# Patient Record
Sex: Male | Born: 1960
Health system: Southern US, Community
[De-identification: ages and names within clinical notes are randomized; demographics above are authoritative.]

## PROBLEM LIST (undated history)

## (undated) DIAGNOSIS — K589 Irritable bowel syndrome without diarrhea: Secondary | ICD-10-CM

## (undated) DIAGNOSIS — K5120 Ulcerative (chronic) proctitis without complications: Secondary | ICD-10-CM

## (undated) HISTORY — PX: HERNIA REPAIR: SHX51

---

## 2018-09-01 DIAGNOSIS — M5481 Occipital neuralgia: Secondary | ICD-10-CM | POA: Diagnosis not present

## 2018-09-01 DIAGNOSIS — G8929 Other chronic pain: Secondary | ICD-10-CM | POA: Diagnosis not present

## 2018-09-08 DIAGNOSIS — M542 Cervicalgia: Secondary | ICD-10-CM | POA: Diagnosis not present

## 2018-09-10 DIAGNOSIS — M542 Cervicalgia: Secondary | ICD-10-CM | POA: Diagnosis not present

## 2018-09-16 DIAGNOSIS — M542 Cervicalgia: Secondary | ICD-10-CM | POA: Diagnosis not present

## 2018-09-17 DIAGNOSIS — M542 Cervicalgia: Secondary | ICD-10-CM | POA: Diagnosis not present

## 2018-10-12 DIAGNOSIS — G44229 Chronic tension-type headache, not intractable: Secondary | ICD-10-CM | POA: Diagnosis not present

## 2018-10-12 DIAGNOSIS — G4489 Other headache syndrome: Secondary | ICD-10-CM | POA: Diagnosis not present

## 2018-10-12 DIAGNOSIS — R531 Weakness: Secondary | ICD-10-CM | POA: Diagnosis not present

## 2018-10-12 DIAGNOSIS — M2560 Stiffness of unspecified joint, not elsewhere classified: Secondary | ICD-10-CM | POA: Diagnosis not present

## 2018-10-20 DIAGNOSIS — R531 Weakness: Secondary | ICD-10-CM | POA: Diagnosis not present

## 2018-10-20 DIAGNOSIS — G44229 Chronic tension-type headache, not intractable: Secondary | ICD-10-CM | POA: Diagnosis not present

## 2018-10-20 DIAGNOSIS — G4489 Other headache syndrome: Secondary | ICD-10-CM | POA: Diagnosis not present

## 2018-10-20 DIAGNOSIS — M2560 Stiffness of unspecified joint, not elsewhere classified: Secondary | ICD-10-CM | POA: Diagnosis not present

## 2018-11-02 DIAGNOSIS — M47812 Spondylosis without myelopathy or radiculopathy, cervical region: Secondary | ICD-10-CM | POA: Diagnosis not present

## 2018-11-02 DIAGNOSIS — G4489 Other headache syndrome: Secondary | ICD-10-CM | POA: Diagnosis not present

## 2018-11-02 DIAGNOSIS — M47896 Other spondylosis, lumbar region: Secondary | ICD-10-CM | POA: Diagnosis not present

## 2018-11-02 DIAGNOSIS — M48061 Spinal stenosis, lumbar region without neurogenic claudication: Secondary | ICD-10-CM | POA: Diagnosis not present

## 2018-11-02 DIAGNOSIS — M9971 Connective tissue and disc stenosis of intervertebral foramina of cervical region: Secondary | ICD-10-CM | POA: Diagnosis not present

## 2018-11-03 DIAGNOSIS — G4489 Other headache syndrome: Secondary | ICD-10-CM | POA: Diagnosis not present

## 2018-11-03 DIAGNOSIS — Z6826 Body mass index (BMI) 26.0-26.9, adult: Secondary | ICD-10-CM | POA: Diagnosis not present

## 2018-11-16 DIAGNOSIS — M530 Cervicocranial syndrome: Secondary | ICD-10-CM | POA: Diagnosis not present

## 2018-11-24 DIAGNOSIS — M530 Cervicocranial syndrome: Secondary | ICD-10-CM | POA: Diagnosis not present

## 2018-11-26 DIAGNOSIS — H524 Presbyopia: Secondary | ICD-10-CM | POA: Diagnosis not present

## 2018-11-26 DIAGNOSIS — H2513 Age-related nuclear cataract, bilateral: Secondary | ICD-10-CM | POA: Diagnosis not present

## 2018-11-26 DIAGNOSIS — H43813 Vitreous degeneration, bilateral: Secondary | ICD-10-CM | POA: Diagnosis not present

## 2018-12-01 DIAGNOSIS — M530 Cervicocranial syndrome: Secondary | ICD-10-CM | POA: Diagnosis not present

## 2018-12-02 DIAGNOSIS — Z0000 Encounter for general adult medical examination without abnormal findings: Secondary | ICD-10-CM | POA: Diagnosis not present

## 2018-12-08 DIAGNOSIS — M530 Cervicocranial syndrome: Secondary | ICD-10-CM | POA: Diagnosis not present

## 2018-12-17 DIAGNOSIS — Z0000 Encounter for general adult medical examination without abnormal findings: Secondary | ICD-10-CM | POA: Diagnosis not present

## 2018-12-17 DIAGNOSIS — Z6826 Body mass index (BMI) 26.0-26.9, adult: Secondary | ICD-10-CM | POA: Diagnosis not present

## 2018-12-17 DIAGNOSIS — Z23 Encounter for immunization: Secondary | ICD-10-CM | POA: Diagnosis not present

## 2018-12-25 DIAGNOSIS — M530 Cervicocranial syndrome: Secondary | ICD-10-CM | POA: Diagnosis not present

## 2019-05-04 DIAGNOSIS — R109 Unspecified abdominal pain: Secondary | ICD-10-CM | POA: Diagnosis not present

## 2019-05-14 DIAGNOSIS — K5900 Constipation, unspecified: Secondary | ICD-10-CM | POA: Diagnosis not present

## 2019-06-29 ENCOUNTER — Other Ambulatory Visit: Payer: Self-pay | Admitting: Gastroenterology

## 2019-06-29 DIAGNOSIS — K5900 Constipation, unspecified: Secondary | ICD-10-CM | POA: Diagnosis not present

## 2019-06-29 DIAGNOSIS — R109 Unspecified abdominal pain: Secondary | ICD-10-CM | POA: Diagnosis not present

## 2019-06-29 DIAGNOSIS — R634 Abnormal weight loss: Secondary | ICD-10-CM | POA: Diagnosis not present

## 2019-07-07 ENCOUNTER — Other Ambulatory Visit: Payer: Self-pay | Admitting: Gastroenterology

## 2019-07-07 DIAGNOSIS — R109 Unspecified abdominal pain: Secondary | ICD-10-CM

## 2019-07-07 DIAGNOSIS — R194 Change in bowel habit: Secondary | ICD-10-CM

## 2019-07-07 DIAGNOSIS — R634 Abnormal weight loss: Secondary | ICD-10-CM

## 2019-07-09 ENCOUNTER — Ambulatory Visit
Admission: RE | Admit: 2019-07-09 | Discharge: 2019-07-09 | Disposition: A | Discharge status: Home / Self Care | Payer: BC Managed Care – PPO | Source: Ambulatory Visit | Attending: Gastroenterology | Admitting: Gastroenterology

## 2019-07-09 ENCOUNTER — Other Ambulatory Visit: Payer: Self-pay

## 2019-07-09 DIAGNOSIS — R634 Abnormal weight loss: Secondary | ICD-10-CM

## 2019-07-09 DIAGNOSIS — R109 Unspecified abdominal pain: Secondary | ICD-10-CM

## 2019-07-09 DIAGNOSIS — R194 Change in bowel habit: Secondary | ICD-10-CM

## 2019-07-15 DIAGNOSIS — R109 Unspecified abdominal pain: Secondary | ICD-10-CM | POA: Diagnosis not present

## 2019-07-29 ENCOUNTER — Other Ambulatory Visit: Payer: Self-pay

## 2019-07-29 ENCOUNTER — Ambulatory Visit
Admission: RE | Admit: 2019-07-29 | Discharge: 2019-07-29 | Disposition: A | Discharge status: Home / Self Care | Payer: BC Managed Care – PPO | Source: Ambulatory Visit | Attending: Gastroenterology | Admitting: Gastroenterology

## 2019-07-29 DIAGNOSIS — R109 Unspecified abdominal pain: Secondary | ICD-10-CM

## 2019-07-29 MED ORDER — IOPAMIDOL (ISOVUE-300) INJECTION 61%
100.0000 mL | Freq: Once | INTRAVENOUS | Status: AC | PRN
  Administered 2019-07-29: 100 mL via INTRAVENOUS

## 2019-08-26 DIAGNOSIS — Z86010 Personal history of colonic polyps: Secondary | ICD-10-CM | POA: Diagnosis not present

## 2019-08-26 DIAGNOSIS — R109 Unspecified abdominal pain: Secondary | ICD-10-CM | POA: Diagnosis not present

## 2019-11-04 DIAGNOSIS — Z1159 Encounter for screening for other viral diseases: Secondary | ICD-10-CM | POA: Diagnosis not present

## 2019-11-09 DIAGNOSIS — Z86010 Personal history of colonic polyps: Secondary | ICD-10-CM | POA: Diagnosis not present

## 2019-11-09 DIAGNOSIS — R1013 Epigastric pain: Secondary | ICD-10-CM | POA: Diagnosis not present

## 2019-11-09 DIAGNOSIS — K2930 Chronic superficial gastritis without bleeding: Secondary | ICD-10-CM | POA: Diagnosis not present

## 2019-11-09 DIAGNOSIS — D122 Benign neoplasm of ascending colon: Secondary | ICD-10-CM | POA: Diagnosis not present

## 2020-01-18 DIAGNOSIS — R42 Dizziness and giddiness: Secondary | ICD-10-CM | POA: Diagnosis not present

## 2020-01-18 DIAGNOSIS — M62838 Other muscle spasm: Secondary | ICD-10-CM | POA: Diagnosis not present

## 2020-01-18 DIAGNOSIS — R0789 Other chest pain: Secondary | ICD-10-CM | POA: Diagnosis not present

## 2020-02-01 DIAGNOSIS — H1045 Other chronic allergic conjunctivitis: Secondary | ICD-10-CM | POA: Diagnosis not present

## 2020-02-01 DIAGNOSIS — H00011 Hordeolum externum right upper eyelid: Secondary | ICD-10-CM | POA: Diagnosis not present

## 2020-02-03 DIAGNOSIS — Z0000 Encounter for general adult medical examination without abnormal findings: Secondary | ICD-10-CM | POA: Diagnosis not present

## 2020-02-21 DIAGNOSIS — I700 Atherosclerosis of aorta: Secondary | ICD-10-CM | POA: Diagnosis not present

## 2020-03-03 DIAGNOSIS — R0602 Shortness of breath: Secondary | ICD-10-CM | POA: Diagnosis not present

## 2020-03-03 DIAGNOSIS — R059 Cough, unspecified: Secondary | ICD-10-CM | POA: Diagnosis not present

## 2020-03-03 DIAGNOSIS — Z03818 Encounter for observation for suspected exposure to other biological agents ruled out: Secondary | ICD-10-CM | POA: Diagnosis not present

## 2020-03-09 DIAGNOSIS — N401 Enlarged prostate with lower urinary tract symptoms: Secondary | ICD-10-CM | POA: Diagnosis not present

## 2020-03-09 DIAGNOSIS — N329 Bladder disorder, unspecified: Secondary | ICD-10-CM | POA: Diagnosis not present

## 2020-03-09 DIAGNOSIS — R3915 Urgency of urination: Secondary | ICD-10-CM | POA: Diagnosis not present

## 2020-04-18 DIAGNOSIS — Z0000 Encounter for general adult medical examination without abnormal findings: Secondary | ICD-10-CM | POA: Diagnosis not present

## 2020-04-18 DIAGNOSIS — Z1389 Encounter for screening for other disorder: Secondary | ICD-10-CM | POA: Diagnosis not present

## 2020-04-18 DIAGNOSIS — Z6825 Body mass index (BMI) 25.0-25.9, adult: Secondary | ICD-10-CM | POA: Diagnosis not present

## 2020-04-18 DIAGNOSIS — Z23 Encounter for immunization: Secondary | ICD-10-CM | POA: Diagnosis not present

## 2020-05-01 DIAGNOSIS — N329 Bladder disorder, unspecified: Secondary | ICD-10-CM | POA: Diagnosis not present

## 2020-05-01 DIAGNOSIS — R102 Pelvic and perineal pain: Secondary | ICD-10-CM | POA: Diagnosis not present

## 2020-05-01 DIAGNOSIS — R3915 Urgency of urination: Secondary | ICD-10-CM | POA: Diagnosis not present

## 2020-05-01 DIAGNOSIS — N401 Enlarged prostate with lower urinary tract symptoms: Secondary | ICD-10-CM | POA: Diagnosis not present

## 2020-05-10 DIAGNOSIS — M6281 Muscle weakness (generalized): Secondary | ICD-10-CM | POA: Diagnosis not present

## 2020-05-10 DIAGNOSIS — M62838 Other muscle spasm: Secondary | ICD-10-CM | POA: Diagnosis not present

## 2020-05-10 DIAGNOSIS — M6289 Other specified disorders of muscle: Secondary | ICD-10-CM | POA: Diagnosis not present

## 2020-05-10 DIAGNOSIS — R102 Pelvic and perineal pain: Secondary | ICD-10-CM | POA: Diagnosis not present

## 2020-05-31 DIAGNOSIS — M6281 Muscle weakness (generalized): Secondary | ICD-10-CM | POA: Diagnosis not present

## 2020-05-31 DIAGNOSIS — R102 Pelvic and perineal pain: Secondary | ICD-10-CM | POA: Diagnosis not present

## 2020-05-31 DIAGNOSIS — M62838 Other muscle spasm: Secondary | ICD-10-CM | POA: Diagnosis not present

## 2020-05-31 DIAGNOSIS — M6289 Other specified disorders of muscle: Secondary | ICD-10-CM | POA: Diagnosis not present

## 2020-07-11 DIAGNOSIS — R102 Pelvic and perineal pain: Secondary | ICD-10-CM | POA: Diagnosis not present

## 2020-09-05 DIAGNOSIS — K1370 Unspecified lesions of oral mucosa: Secondary | ICD-10-CM | POA: Diagnosis not present

## 2020-09-08 IMAGING — US US ABDOMEN COMPLETE
1 series · 14 of 25 positions shown · non-contrast
Comparison: None

CLINICAL DATA: Abdominal pain, weight loss and constant for 6 weeks

EXAM:
ABDOMEN ULTRASOUND COMPLETE

[Series 1: us abdomen complete · 0.21mm/px · 14 of 77 slices shown]
[im 1/77]
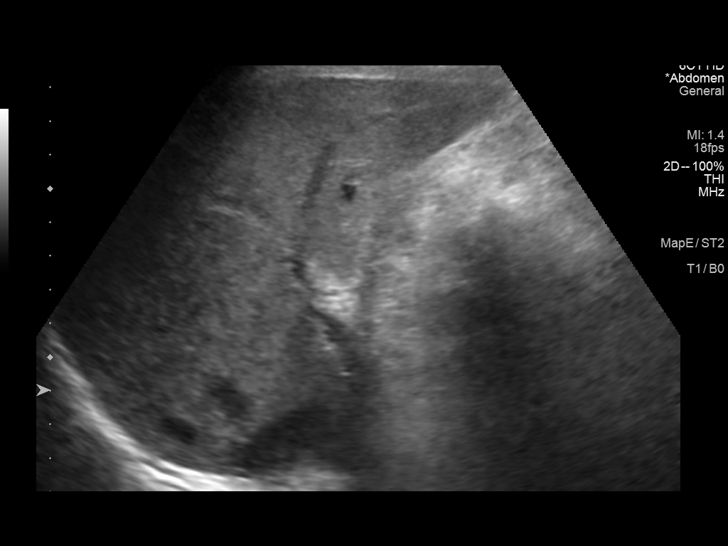
[im 7/77]
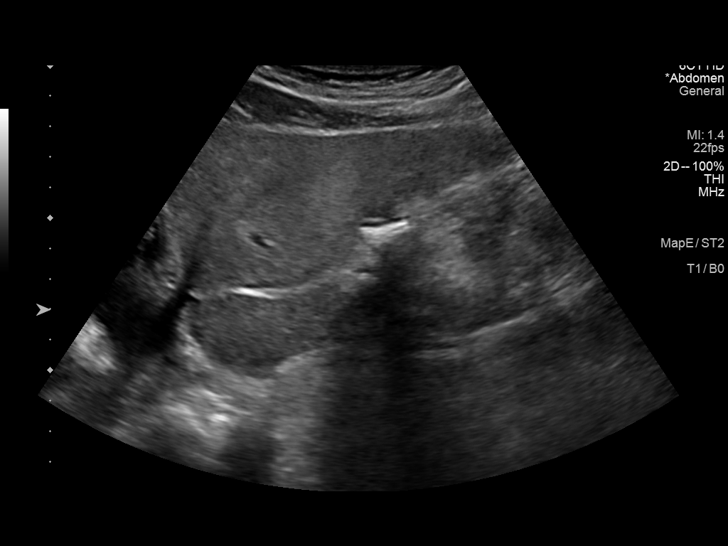
[im 13/77]
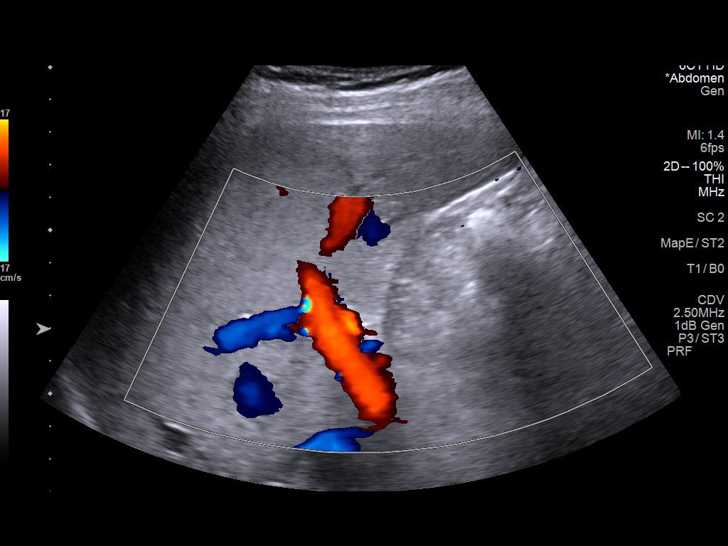
[im 20/77]
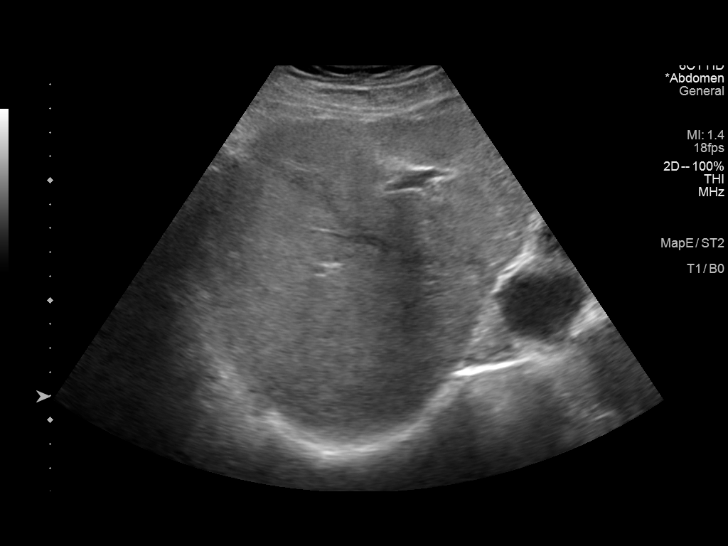
[im 26/77]
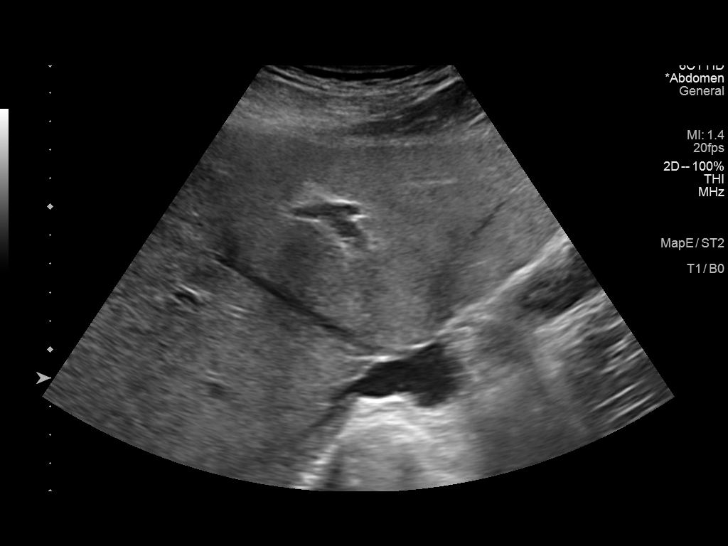
[im 29/77]
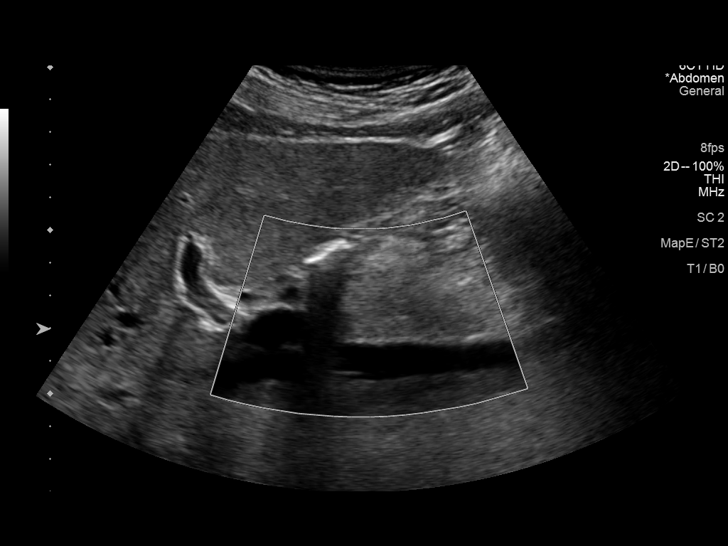
[im 35/77]
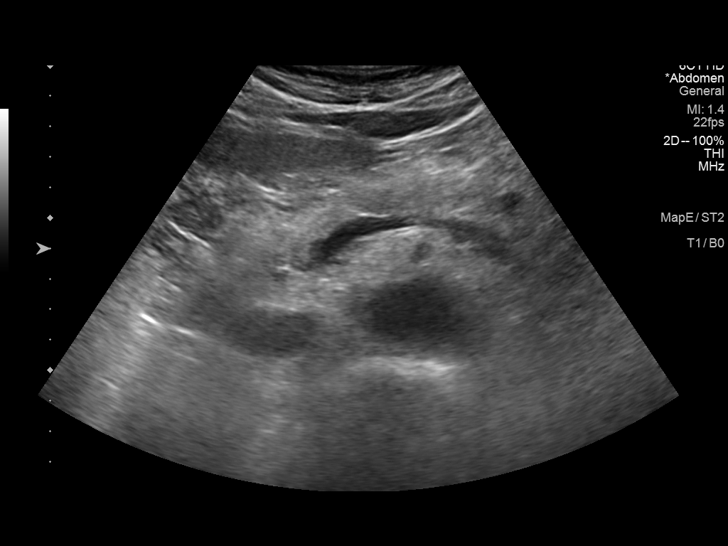
[im 42/77]
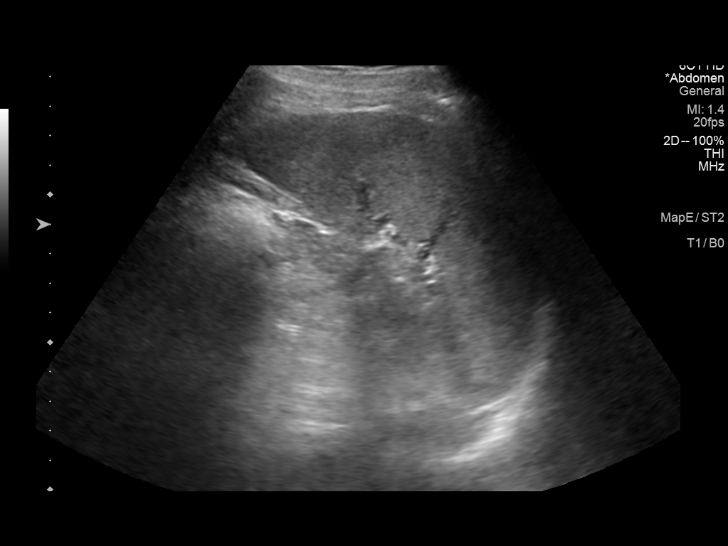
[im 48/77]
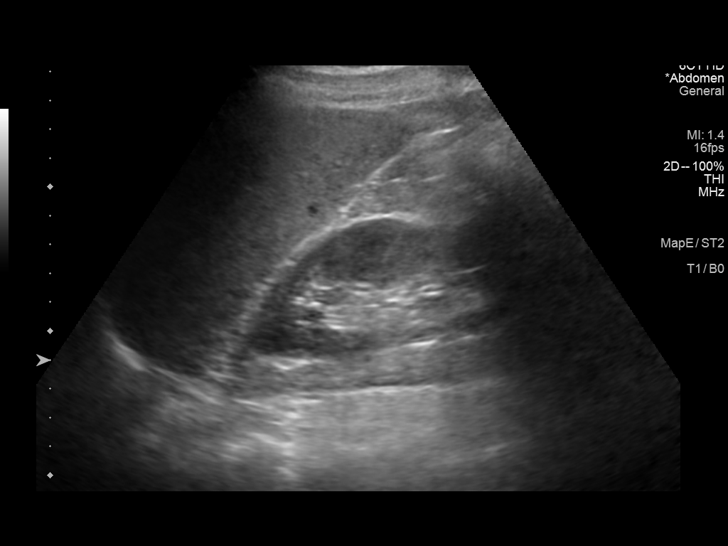
[im 51/77]
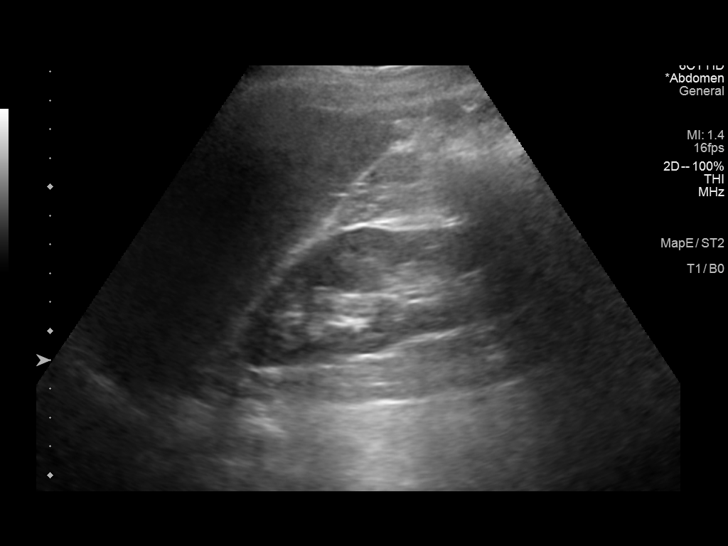
[im 58/77]
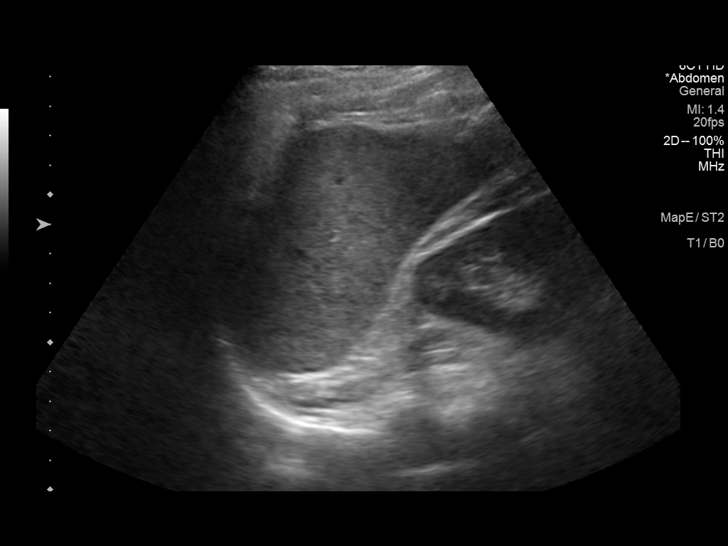
[im 64/77]
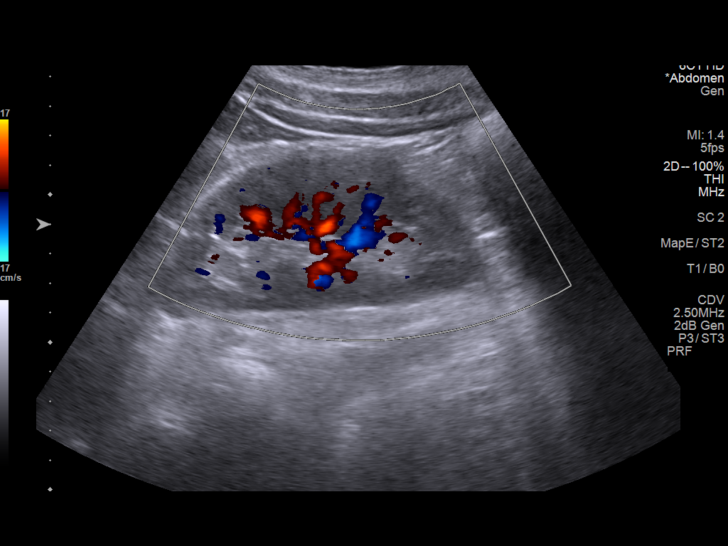
[im 70/77]
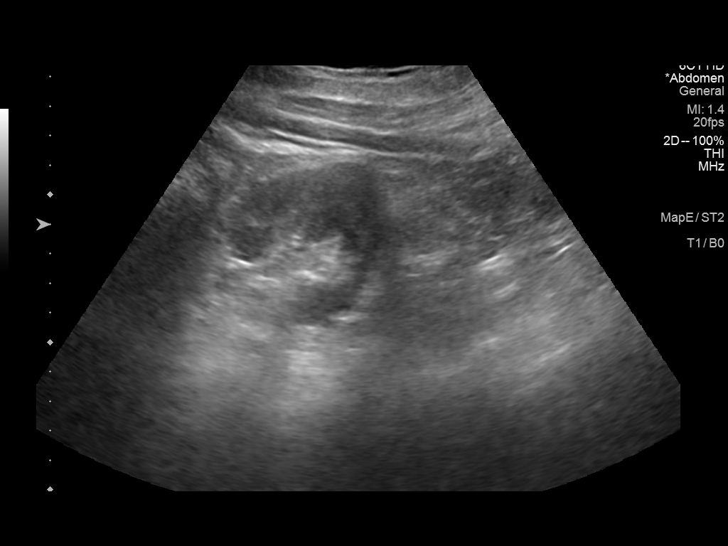
[im 77/77]
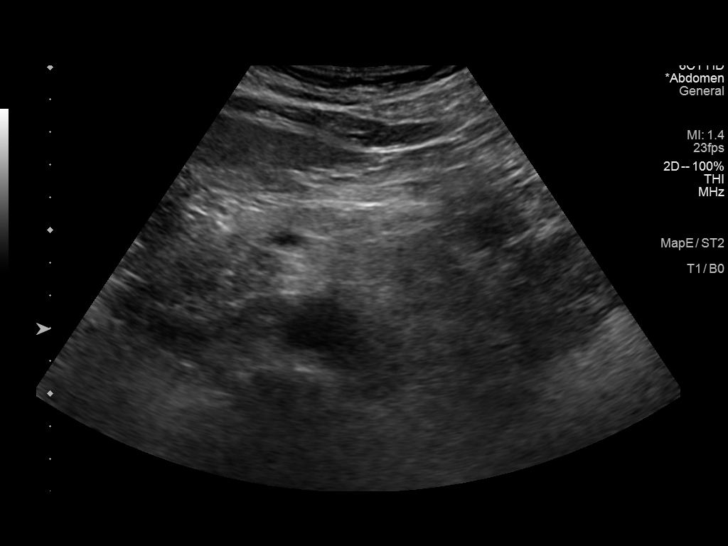

[14 of 25 positions shown; findings below may reference images not displayed]

FINDINGS: Gallbladder: Surgically absent

Common bile duct: Diameter: 4 mm, normal

Liver: Echogenic parenchyma, likely fatty infiltration though this
can be seen with cirrhosis and certain infiltrative disorders. No
focal hepatic mass or nodularity. No intrahepatic biliary
dilatation. Portal vein is patent on color Doppler imaging with
normal direction of blood flow towards the liver.

IVC: Normal appearance

Pancreas: Normal appearance

Spleen: Normal appearance, 9.8 cm length

Right Kidney: Length: 11.3 cm. Normal morphology without mass or
hydronephrosis.

Left Kidney: Length: 12.3 cm. Normal morphology without mass or
hydronephrosis.

Abdominal aorta: Normal caliber

Other findings: No free fluid
IMPRESSION: Post cholecystectomy.

Probable fatty infiltration of liver.

Otherwise normal exam.

## 2020-09-28 IMAGING — CT CT ABD-PELV W/ CM
1 of 2 series · 15 of 32 positions shown, 19 images · IV contrast (APPLIED)
Comparison: 07/09/2019 abdominal sonogram.

CLINICAL DATA: Right upper quadrant abdominal pain for 3 months
with 12 pound weight loss in the last 5 months. History of inguinal
hernia repair.

EXAM:
CT ABDOMEN AND PELVIS WITH CONTRAST
TECHNIQUE: Multidetector CT imaging of the abdomen and pelvis was performed
using the standard protocol following bolus administration of
intravenous contrast.
CONTRAST:  100mL 30VUOC-0EE IOPAMIDOL (30VUOC-0EE) INJECTION 61%

[Series 2: abd/pelvis w/cm · axial · 0.71mm/px · z∈[-507,-82]mm · 15 of 95 slices shown, 19 images]
[im 5/95  soft-tissue]
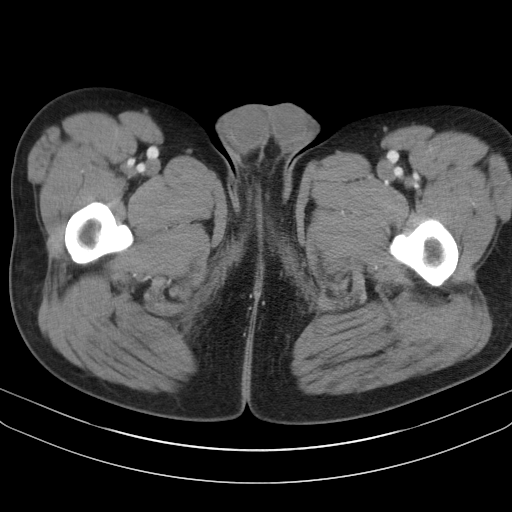
[im 5/95  bone]
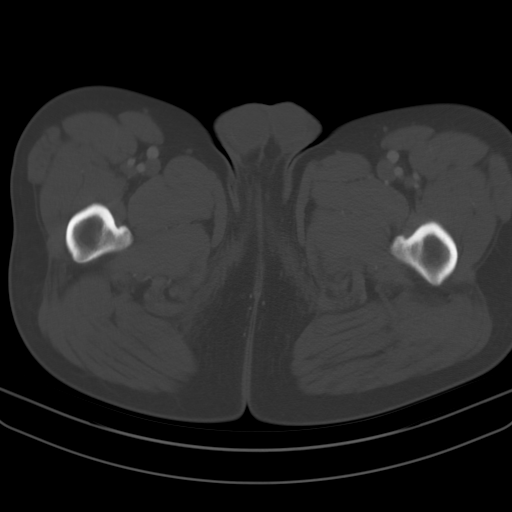
[im 13/95  soft-tissue]
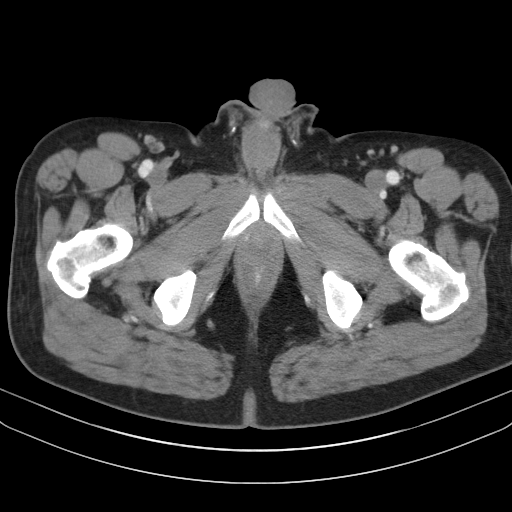
[im 22/95  soft-tissue]
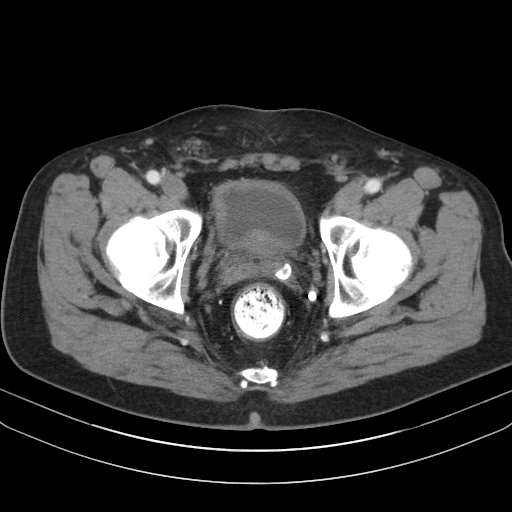
[im 26/95  soft-tissue]
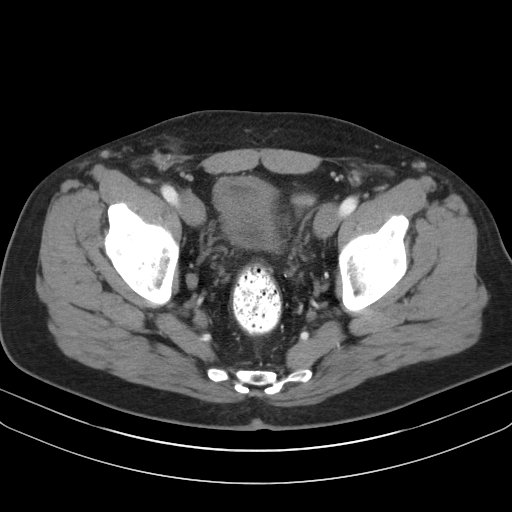
[im 35/95  soft-tissue]
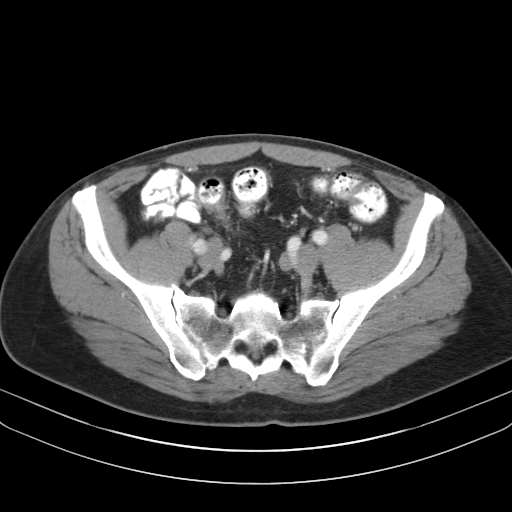
[im 39/95  soft-tissue]
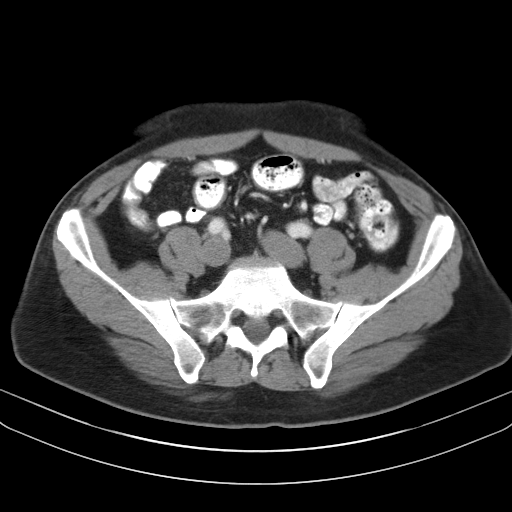
[im 48/95  soft-tissue]
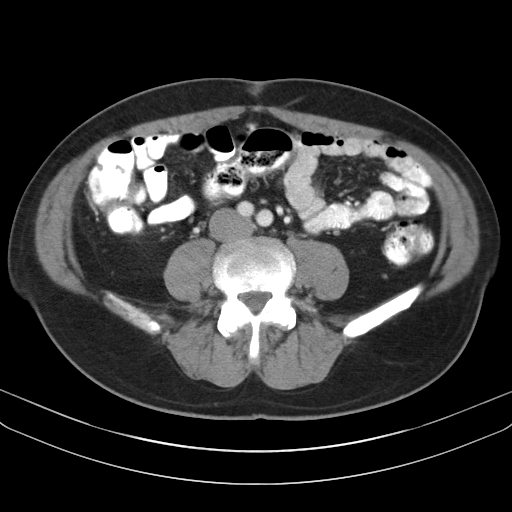
[im 56/95  soft-tissue]
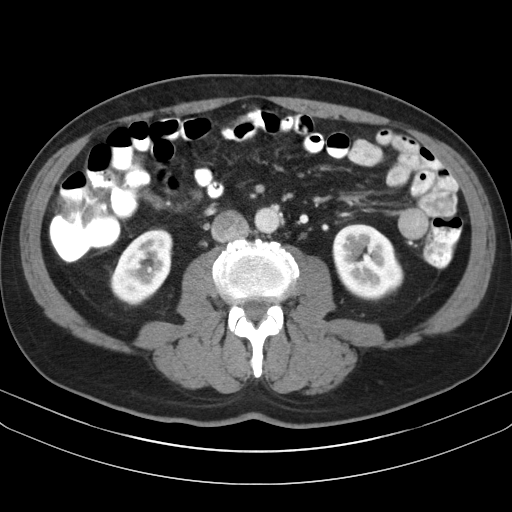
[im 60/95  soft-tissue]
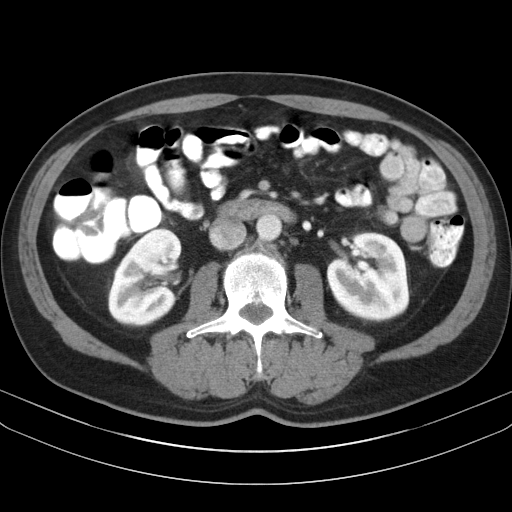
[im 60/95  bone]
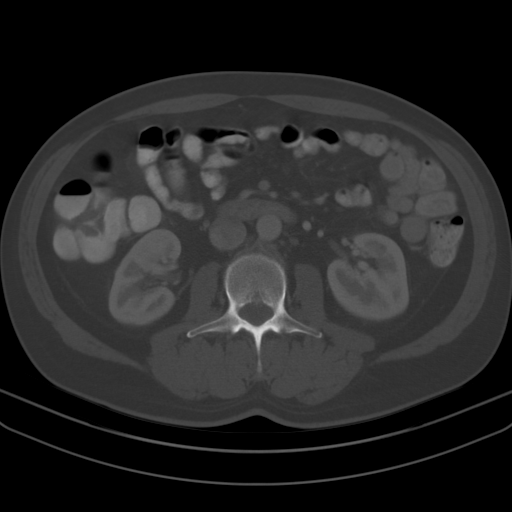
[im 69/95  soft-tissue]
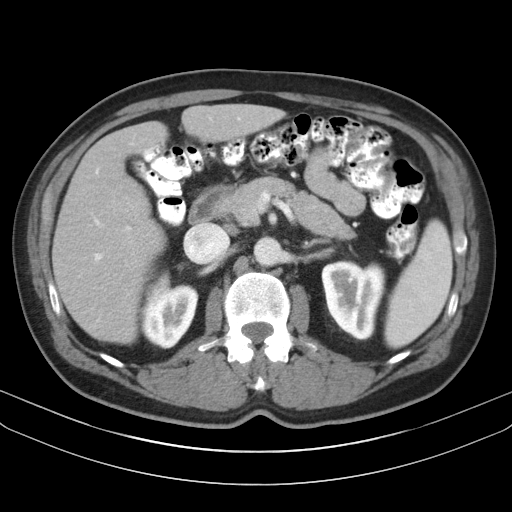
[im 73/95  soft-tissue]
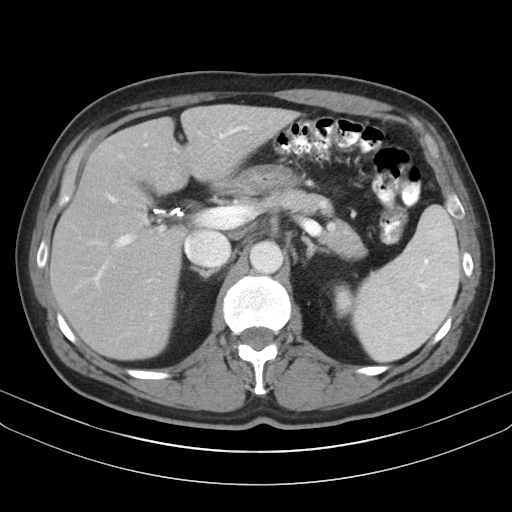
[im 77/95  lung]
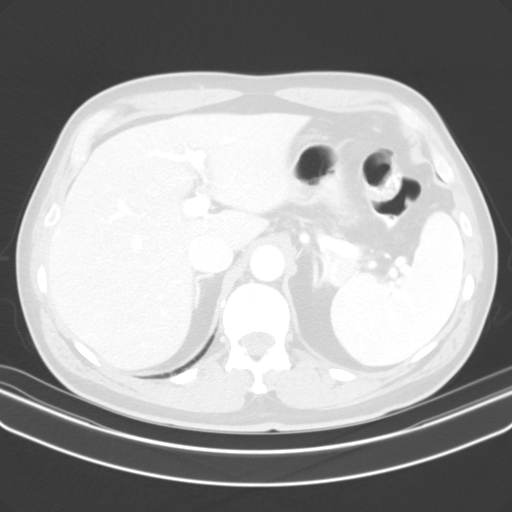
[im 82/95  soft-tissue]
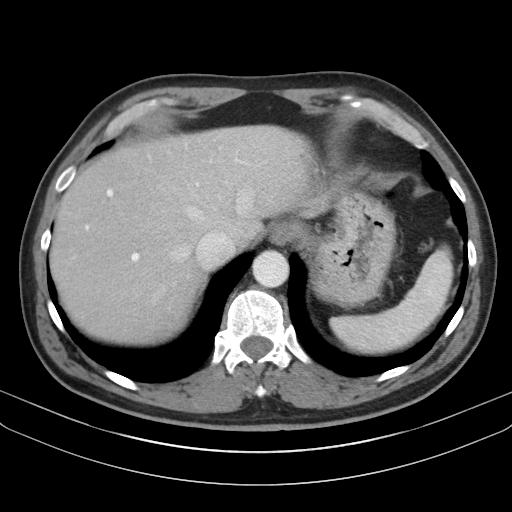
[im 82/95  lung]
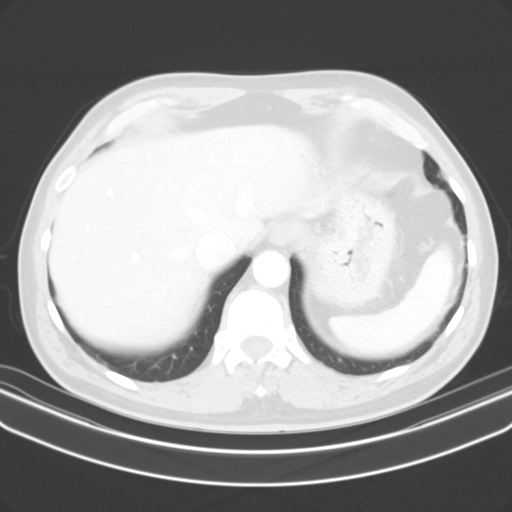
[im 86/95  lung]
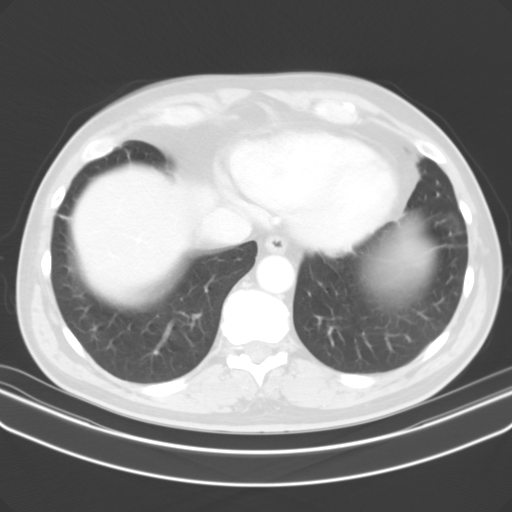
[im 90/95  soft-tissue]
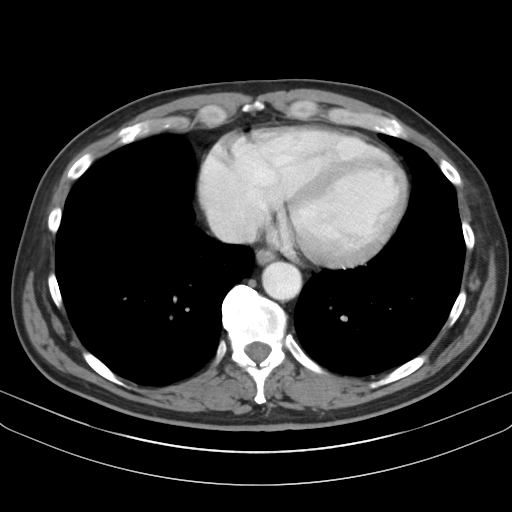
[im 90/95  lung]
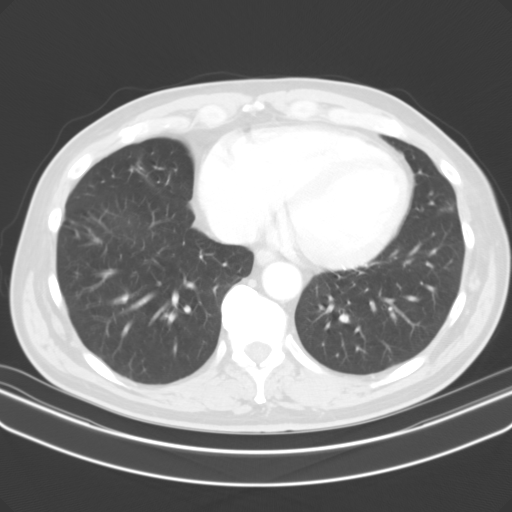

[15 of 32 positions shown; findings below may reference images not displayed]

FINDINGS: Lower chest: No significant pulmonary nodules or acute consolidative
airspace disease.

Hepatobiliary: Normal liver size. No liver mass. Cholecystectomy.
Bile ducts are within normal post cholecystectomy limits with CBD
diameter 4 mm.

Pancreas: Normal, with no mass or duct dilation.

Spleen: Normal size. No mass.

Adrenals/Urinary Tract: Normal adrenals. Normal kidneys with no
hydronephrosis and no renal mass. Suggestion of mild diffuse bladder
wall thickening. No significant bladder distention. Scratch

Stomach/Bowel: Normal non-distended stomach. Normal caliber small
bowel with no small bowel wall thickening. Normal appendix. Oral
contrast transits to the rectum. Normal large bowel with no
diverticulosis, large bowel wall thickening or pericolonic fat
stranding.

Vascular/Lymphatic: Atherosclerotic nonaneurysmal abdominal aorta.
Patent portal, splenic, hepatic and renal veins. Retroaortic left
renal vein. No pathologically enlarged lymph nodes in the abdomen or
pelvis.

Reproductive: Mild prostatomegaly.

Other: No pneumoperitoneum, ascites or focal fluid collection.

Musculoskeletal: No aggressive appearing focal osseous lesions. Mild
thoracolumbar spondylosis.
IMPRESSION: 1. No acute abnormality. No evidence of bowel obstruction or acute
bowel inflammation. Normal appendix.
2. Bile ducts are within normal post cholecystectomy limits.
3. Suggestion of mild diffuse bladder wall thickening, which could
be due to chronic bladder outlet obstruction by the mildly enlarged
prostate. Consider correlation with urinalysis.
4. Aortic Atherosclerosis ([id number]).

## 2020-11-06 DIAGNOSIS — R1011 Right upper quadrant pain: Secondary | ICD-10-CM | POA: Diagnosis not present

## 2020-11-16 DIAGNOSIS — R1011 Right upper quadrant pain: Secondary | ICD-10-CM | POA: Diagnosis not present

## 2020-11-16 DIAGNOSIS — R194 Change in bowel habit: Secondary | ICD-10-CM | POA: Diagnosis not present

## 2020-11-16 DIAGNOSIS — Z6825 Body mass index (BMI) 25.0-25.9, adult: Secondary | ICD-10-CM | POA: Diagnosis not present

## 2020-12-01 DIAGNOSIS — K2900 Acute gastritis without bleeding: Secondary | ICD-10-CM | POA: Diagnosis not present

## 2020-12-14 ENCOUNTER — Other Ambulatory Visit: Payer: Self-pay

## 2020-12-14 ENCOUNTER — Encounter (HOSPITAL_BASED_OUTPATIENT_CLINIC_OR_DEPARTMENT_OTHER): Payer: Self-pay | Admitting: *Deleted

## 2020-12-14 ENCOUNTER — Emergency Department (HOSPITAL_BASED_OUTPATIENT_CLINIC_OR_DEPARTMENT_OTHER)
Admission: EM | Admit: 2020-12-14 | Discharge: 2020-12-14 | Disposition: A | Discharge status: Home / Self Care | Payer: BC Managed Care – PPO | Attending: Emergency Medicine | Admitting: Emergency Medicine

## 2020-12-14 ENCOUNTER — Emergency Department (HOSPITAL_BASED_OUTPATIENT_CLINIC_OR_DEPARTMENT_OTHER): Payer: BC Managed Care – PPO

## 2020-12-14 DIAGNOSIS — R197 Diarrhea, unspecified: Secondary | ICD-10-CM | POA: Diagnosis not present

## 2020-12-14 DIAGNOSIS — K625 Hemorrhage of anus and rectum: Secondary | ICD-10-CM | POA: Insufficient documentation

## 2020-12-14 DIAGNOSIS — R109 Unspecified abdominal pain: Secondary | ICD-10-CM

## 2020-12-14 DIAGNOSIS — R1011 Right upper quadrant pain: Secondary | ICD-10-CM | POA: Insufficient documentation

## 2020-12-14 HISTORY — DX: Irritable bowel syndrome without diarrhea: K58.9

## 2020-12-14 LAB — COMPREHENSIVE METABOLIC PANEL
ALT: 13 U/L (ref 0–44)
AST: 14 U/L — ABNORMAL LOW (ref 15–41)
Albumin: 4.5 g/dL (ref 3.5–5.0)
Alkaline Phosphatase: 49 U/L (ref 38–126)
Anion gap: 7 (ref 5–15)
BUN: 13 mg/dL (ref 8–23)
CO2: 29 mmol/L (ref 22–32)
Calcium: 9.6 mg/dL (ref 8.9–10.3)
Chloride: 103 mmol/L (ref 98–111)
Creatinine, Ser: 0.89 mg/dL (ref 0.61–1.24)
GFR, Estimated: >60 mL/min (ref >60)
Glucose, Bld: 92 mg/dL (ref 70–99)
Potassium: 4.2 mmol/L (ref 3.5–5.1)
Sodium: 139 mmol/L (ref 135–145)
Total Bilirubin: 0.7 mg/dL (ref 0.3–1.2)
Total Protein: 6.8 g/dL (ref 6.5–8.1)

## 2020-12-14 LAB — CBC
HCT: 42.3 % (ref 39.0–52.0)
Hemoglobin: 14.3 g/dL (ref 13.0–17.0)
MCH: 31.7 pg (ref 26.0–34.0)
MCHC: 33.8 g/dL (ref 30.0–36.0)
MCV: 93.8 fL (ref 80.0–100.0)
Platelets: 169 10*3/uL (ref 150–400)
RBC: 4.51 MIL/uL (ref 4.22–5.81)
RDW: 13.3 % (ref 11.5–15.5)
WBC: 4.5 10*3/uL (ref 4.0–10.5)
nRBC: 0 % (ref 0.0–0.2)

## 2020-12-14 LAB — LIPASE, BLOOD: Lipase: 18 U/L (ref 11–51)

## 2020-12-14 MED ORDER — IOHEXOL 350 MG/ML SOLN
75.0000 mL | Freq: Once | INTRAVENOUS | Status: AC | PRN
  Administered 2020-12-14: 75 mL via INTRAVENOUS

## 2020-12-14 MED ORDER — AMOXICILLIN-POT CLAVULANATE 875-125 MG PO TABS
1.0000 | ORAL_TABLET | Freq: Two times a day (BID) | ORAL | 0 refills | Status: AC
Start: 2020-12-14 — End: ?

## 2020-12-14 NOTE — ED Triage Notes (Signed)
Upper rt abd pain when pressure applied, red rectal bleeding, stools loose with mucus also noted some dark tarry stools. No N/V Unable to sleep on either side due to pain at night.

## 2020-12-14 NOTE — ED Provider Notes (Signed)
[facility] EMERGENCY DEPT Provider Note   CSN: [id number] Arrival date & time: 12/14/20  1155     History Chief Complaint  Patient presents with   Abdominal Pain   Rectal Bleeding    Bradley Lamb is a 60 y.o. male.  He is here with a complaint of right upper quadrant abdominal pain and loose stools sometimes associated with blood been going on for at least 2 weeks up to 6 weeks.  He feels he has been losing weight, low-grade fevers.  He tried to get in with his GI doctor but is unable to get her appointment.  His primary care doctor started on some dicyclomine and amitriptyline.  He states his fever was as high as 99.8.  The history is provided by the patient.  Abdominal Pain Pain location:  RUQ Onset quality:  Gradual Duration:  2 weeks Progression:  Unchanged Chronicity:  New Relieved by:  Nothing Worsened by:  Nothing Ineffective treatments:  None tried Associated symptoms: diarrhea   Associated symptoms: no chest pain, no dysuria, no fever, no shortness of breath and no sore throat       Past Medical History:  Diagnosis Date   IBS (irritable bowel syndrome)     There are no problems to display for this patient.   Past Surgical History:  Procedure Laterality Date   HERNIA REPAIR         No family history on file.  Social History   Tobacco Use   Smoking status: Never   Smokeless tobacco: Never  Vaping Use   Vaping Use: Never used  Substance Use Topics   Alcohol use: Yes    Comment: occ   Drug use: Never    Home Medications Prior to Admission medications   Not on File    Allergies    Patient has no known allergies.  Review of Systems   Review of Systems  Constitutional:  Negative for fever.  HENT:  Negative for sore throat.   Eyes:  Negative for visual disturbance.  Respiratory:  Negative for shortness of breath.   Cardiovascular:  Negative for chest pain.  Gastrointestinal:  Positive for abdominal pain, blood in stool and  diarrhea.  Genitourinary:  Negative for dysuria.  Musculoskeletal:  Negative for neck pain.  Skin:  Negative for rash.  Neurological:  Negative for headaches.   Physical Exam Updated Vital Signs BP 130/82 (BP Location: Right Arm)   Pulse 69   Temp 98.3 F (36.8 C) (Oral)   Resp 16   Ht 5\' 10"  (1.778 m)   Wt 76.2 kg   SpO2 100%   BMI 24.11 kg/m   Physical Exam Vitals and nursing note reviewed.  Constitutional:      Appearance: Normal appearance. He is well-developed.  HENT:     Head: Normocephalic and atraumatic.  Eyes:     Conjunctiva/sclera: Conjunctivae normal.  Cardiovascular:     Rate and Rhythm: Normal rate and regular rhythm.     Heart sounds: No murmur heard. Pulmonary:     Effort: Pulmonary effort is normal. No respiratory distress.     Breath sounds: Normal breath sounds.  Abdominal:     Palpations: Abdomen is soft.     Tenderness: There is no abdominal tenderness. There is no guarding or rebound.  Musculoskeletal:        General: No deformity or signs of injury. Normal range of motion.     Cervical back: Neck supple.  Skin:    General: Skin is  warm and dry.  Neurological:     General: No focal deficit present.     Mental Status: He is alert.    ED Results / Procedures / Treatments   Labs (all labs ordered are listed, but only abnormal results are displayed) Labs Reviewed  COMPREHENSIVE METABOLIC PANEL - Abnormal; Notable for the following components:      Result Value   AST 14 (*)    All other components within normal limits  LIPASE, BLOOD  CBC    EKG None  Radiology CT Abdomen Pelvis W Contrast  Result Date: 12/14/2020 CLINICAL DATA:  Acute abdominal pain and rectal bleeding EXAM: CT ABDOMEN AND PELVIS WITH CONTRAST TECHNIQUE: Multidetector CT imaging of the abdomen and pelvis was performed using the standard protocol following bolus administration of intravenous contrast. CONTRAST:  47mL OMNIPAQUE IOHEXOL 350 MG/ML SOLN COMPARISON:   07/29/2019 FINDINGS: Lower chest: Minimal scarring is noted bilaterally in the bases. Hepatobiliary: No focal liver abnormality is seen. Status post cholecystectomy. No biliary dilatation. Pancreas: Unremarkable. No pancreatic ductal dilatation or surrounding inflammatory changes. Spleen: Normal in size without focal abnormality. Adrenals/Urinary Tract: Adrenal glands are within normal limits. Kidneys demonstrate a normal enhancement pattern bilaterally. No renal calculi or obstructive changes are seen. The bladder is partially distended. Stomach/Bowel: Mild hyperenhancement is noted within the rectal mucosa which may represent some focal colitis. Remainder of the colon appears within normal limits. The appendix is well visualized and fluid-filled although no significant periappendiceal inflammatory changes are noted to suggest appendicitis. Small bowel and stomach are unremarkable. Vascular/Lymphatic: Retroaortic left renal vein is noted. Mild atherosclerotic calcifications are seen. No sizable adenopathy is noted. Reproductive: Prostate is unremarkable. Other: No abdominal wall hernia or abnormality. No abdominopelvic ascites. Musculoskeletal: No acute or significant osseous findings. IMPRESSION: Hyperenhancement of the mucosa is noted in the rectum likely representing some focal colitis. No pooling of contrast material is identified to suggest active hemorrhage. No other focal abnormality is noted. Electronically Signed   By: Alcide Clever M.D.   On: 12/14/2020 17:33    Procedures Procedures   Medications Ordered in ED Medications  iohexol (OMNIPAQUE) 350 MG/ML injection 75 mL (75 mLs Intravenous Contrast Given 12/14/20 1657)    ED Course  I have reviewed the triage vital signs and the nursing notes.  Pertinent labs & imaging results that were available during my care of the patient were reviewed by me and considered in my medical decision making (see chart for details).  Clinical Course as of  12/15/20 0941  Thu Dec 14, 2020  1624 AST(!): 14 [MB]  1757 Reviewed results of CAT scan with patient.  He is comfortable plan for outpatient follow-up with his GI provider.  Cover with prescription for antibiotics.  Return instructions discussed [MB]    Clinical Course User Index [MB] Terrilee Files, MD   MDM Rules/Calculators/A&P                          This patient complains of right upper quadrant abdominal pain rectal bleeding; this involves an extensive number of treatment Options and is a complaint that carries with it a high risk of complications and Morbidity. The differential includes colitis, diverticulitis, peptic ulcer disease, biliary colic, GERD, IBS  I ordered, reviewed and interpreted labs, which included CBC with normal white count normal hemoglobin, chemistries normal, LFTs normal, lipase normal  I ordered imaging studies which included CT abdomen pelvis and I independently    visualized  and interpreted imaging which showed some focal inflammatory changes in the rectum consistent with colitis Previous records obtained and reviewed in epic   After the interventions stated above, I reevaluated the patient and found patient to be hemodynamically stable.  He is status postcholecystectomy states he has had rectal bleeding in the past.  Currently no indications or admission.  Will cover with antibiotics and recommended close follow-up with his GI provider return instructions discussed   Final Clinical Impression(s) / ED Diagnoses Final diagnoses:  Abdominal pain, unspecified abdominal location    Rx / DC Orders ED Discharge Orders          Ordered    amoxicillin-clavulanate (AUGMENTIN) 875-125 MG tablet  Every 12 hours        12/14/20 1758             Terrilee Files, MD 12/15/20 [phone]

## 2020-12-14 NOTE — Discharge Instructions (Addendum)
You are seen in the emergency department for evaluation of abdominal pain.  You had blood work and a CAT scan.  Your CAT scan showed some inflammatory changes in your rectum.  We are covering you with an antibiotic.  Please contact your GI provider for close follow-up.  Return to the emergency department if any worsening or concerning symptoms

## 2020-12-26 DIAGNOSIS — K625 Hemorrhage of anus and rectum: Secondary | ICD-10-CM | POA: Diagnosis not present

## 2020-12-26 DIAGNOSIS — K589 Irritable bowel syndrome without diarrhea: Secondary | ICD-10-CM | POA: Diagnosis not present

## 2020-12-26 DIAGNOSIS — R935 Abnormal findings on diagnostic imaging of other abdominal regions, including retroperitoneum: Secondary | ICD-10-CM | POA: Diagnosis not present

## 2020-12-26 DIAGNOSIS — K639 Disease of intestine, unspecified: Secondary | ICD-10-CM | POA: Diagnosis not present

## 2021-01-03 DIAGNOSIS — K625 Hemorrhage of anus and rectum: Secondary | ICD-10-CM | POA: Diagnosis not present

## 2021-01-08 DIAGNOSIS — K5289 Other specified noninfective gastroenteritis and colitis: Secondary | ICD-10-CM | POA: Diagnosis not present

## 2021-01-08 DIAGNOSIS — R933 Abnormal findings on diagnostic imaging of other parts of digestive tract: Secondary | ICD-10-CM | POA: Diagnosis not present

## 2021-01-08 DIAGNOSIS — K625 Hemorrhage of anus and rectum: Secondary | ICD-10-CM | POA: Diagnosis not present

## 2021-01-15 DIAGNOSIS — K921 Melena: Secondary | ICD-10-CM | POA: Diagnosis not present

## 2021-01-15 DIAGNOSIS — Z23 Encounter for immunization: Secondary | ICD-10-CM | POA: Diagnosis not present

## 2021-01-15 DIAGNOSIS — Z6825 Body mass index (BMI) 25.0-25.9, adult: Secondary | ICD-10-CM | POA: Diagnosis not present

## 2021-01-27 ENCOUNTER — Encounter (HOSPITAL_BASED_OUTPATIENT_CLINIC_OR_DEPARTMENT_OTHER): Payer: Self-pay

## 2021-01-27 ENCOUNTER — Other Ambulatory Visit: Payer: Self-pay

## 2021-01-27 ENCOUNTER — Emergency Department (HOSPITAL_BASED_OUTPATIENT_CLINIC_OR_DEPARTMENT_OTHER)
Admission: EM | Admit: 2021-01-27 | Discharge: 2021-01-27 | Disposition: A | Discharge status: Home / Self Care | Payer: BC Managed Care – PPO | Attending: Emergency Medicine | Admitting: Emergency Medicine

## 2021-01-27 DIAGNOSIS — H5712 Ocular pain, left eye: Secondary | ICD-10-CM | POA: Diagnosis not present

## 2021-01-27 DIAGNOSIS — H16002 Unspecified corneal ulcer, left eye: Secondary | ICD-10-CM | POA: Insufficient documentation

## 2021-01-27 DIAGNOSIS — H16042 Marginal corneal ulcer, left eye: Secondary | ICD-10-CM | POA: Diagnosis not present

## 2021-01-27 HISTORY — DX: Ulcerative (chronic) proctitis without complications: K51.20

## 2021-01-27 MED ORDER — TETRACAINE HCL 0.5 % OP SOLN
2.0000 [drp] | Freq: Once | OPHTHALMIC | Status: AC
  Administered 2021-01-27: 2 [drp] via OPHTHALMIC
  Filled 2021-01-27: qty 4

## 2021-01-27 MED ORDER — FLUORESCEIN SODIUM 1 MG OP STRP
1.0000 | ORAL_STRIP | Freq: Once | OPHTHALMIC | Status: AC
  Administered 2021-01-27: 1 via OPHTHALMIC
  Filled 2021-01-27: qty 1

## 2021-01-27 NOTE — ED Triage Notes (Signed)
Pt is present for left eye pain that started two days ago. Pain progressively worsened yesterday and the left eye became sensitive to light. Difficult to open eye. Denies foreign body. Has tried washing eye out and applying lubricating eye drops with minimal relief. Started mesalamine three days ago and unsure if sx are related.

## 2021-01-27 NOTE — ED Notes (Signed)
Pt verbalizes understanding of discharge instructions. Opportunity for questioning and answers were provided. Armand removed by staff, pt discharged from ED to home. Informed to go to Opthalmology immediately. Directions and phone number given.

## 2021-01-27 NOTE — ED Provider Notes (Signed)
[facility] EMERGENCY Provider Note: Bradley Dell, MD, FACEP  CSN: [id number] MRN: [id number] ARRIVAL: 01/27/21 at 0534 ROOM: DB010/DB010   CHIEF COMPLAINT  Eye Pain   HISTORY OF PRESENT ILLNESS  01/27/21 5:47 AM Bradley Lamb is a 60 y.o. male who started mesalamine 3 days ago for irritable bowel syndrome and ulcerative proctitis.  He is here with 2 days of pain in his left eye.  The onset has been gradual.  He finds it difficult to open the eye.  He rates his pain as an 8 out of 10, worse with exposure to light.  He denies injury to the eye.  He has tried washing his eye and applying lubricating drops with out significant relief.   The patient wears a contact lens in his right eye but not his left.   Past Medical History:  Diagnosis Date   IBS (irritable bowel syndrome)    Ulcerative proctitis (HCC)     Past Surgical History:  Procedure Laterality Date   HERNIA REPAIR      No family history on file.  Social History   Tobacco Use   Smoking status: Never   Smokeless tobacco: Never  Vaping Use   Vaping Use: Never used  Substance Use Topics   Alcohol use: Yes    Comment: occ   Drug use: Never    Prior to Admission medications   Medication Sig Start Date End Date Taking? Authorizing Provider  amoxicillin-clavulanate (AUGMENTIN) 875-125 MG tablet Take 1 tablet by mouth every 12 (twelve) hours. 12/14/20   Terrilee Files, MD    Allergies Patient has no known allergies.   REVIEW OF SYSTEMS  Negative except as noted here or in the History of Present Illness.   PHYSICAL EXAMINATION  Initial Vital Signs Blood pressure (!) 158/92, pulse 71, temperature 98.5 F (36.9 C), temperature source Oral, resp. rate 17, height 5\' 10"  (1.778 m), weight 77.1 kg, SpO2 99 %.  Examination General: Well-developed, well-nourished male in no acute distress; appearance consistent with age of record HENT: normocephalic; atraumatic Eyes: Right pupil round and reactive to light;  left pupil sluggish with photophobia; left conjunctival injection; small opacity of left cornea at about 6:00 with generalized left corneal fluorescein uptake Neck: supple Heart: regular rate and rhythm; no murmurs, rubs or gallops Lungs: clear to auscultation bilaterally Abdomen: soft; nondistended; nontender; no masses or hepatosplenomegaly; bowel sounds present Extremities: No deformity; full range of motion; pulses normal Neurologic: Awake, alert and oriented; motor function intact in all extremities and symmetric; no facial droop Skin: Warm and dry Psychiatric: Normal mood and affect   RESULTS  Summary of this visit's results, reviewed and interpreted by myself:   EKG Interpretation  Date/Time:    Ventricular Rate:    PR Interval:    QRS Duration:   QT Interval:    QTC Calculation:   R Axis:     Text Interpretation:         Laboratory Studies: No results found for this or any previous visit (from the past 24 hour(s)). Imaging Studies: No results found.  ED COURSE and MDM  Nursing notes, initial and subsequent vitals signs, including pulse oximetry, reviewed and interpreted by myself.  Vitals:   01/27/21 0544  BP: (!) 158/92  Pulse: 71  Resp: 17  Temp: 98.5 F (36.9 C)  TempSrc: Oral  SpO2: 99%  Weight: 77.1 kg  Height: 5\' 10"  (1.778 m)   Medications  tetracaine (PONTOCAINE) 0.5 % ophthalmic solution 2 drop (has  no administration in time range)  fluorescein ophthalmic strip 1 strip (has no administration in time range)   Discussed with Dr. Doylene Canning of [facility] ophthalmology.  He will see the patient in his office immediately.   PROCEDURES  Procedures   ED DIAGNOSES     ICD-10-CM   1. Corneal ulcer of left eye  H16.002          Paula Libra, MD 01/27/21 [phone]

## 2021-01-29 DIAGNOSIS — H16042 Marginal corneal ulcer, left eye: Secondary | ICD-10-CM | POA: Diagnosis not present

## 2021-02-02 DIAGNOSIS — H16042 Marginal corneal ulcer, left eye: Secondary | ICD-10-CM | POA: Diagnosis not present

## 2021-02-27 DIAGNOSIS — H2513 Age-related nuclear cataract, bilateral: Secondary | ICD-10-CM | POA: Diagnosis not present

## 2021-02-27 DIAGNOSIS — H524 Presbyopia: Secondary | ICD-10-CM | POA: Diagnosis not present

## 2021-02-27 DIAGNOSIS — H16042 Marginal corneal ulcer, left eye: Secondary | ICD-10-CM | POA: Diagnosis not present

## 2021-03-15 DIAGNOSIS — K5120 Ulcerative (chronic) proctitis without complications: Secondary | ICD-10-CM | POA: Diagnosis not present

## 2021-03-15 DIAGNOSIS — R109 Unspecified abdominal pain: Secondary | ICD-10-CM | POA: Diagnosis not present

## 2021-06-13 DIAGNOSIS — R972 Elevated prostate specific antigen [PSA]: Secondary | ICD-10-CM | POA: Diagnosis not present

## 2021-07-10 DIAGNOSIS — C4401 Basal cell carcinoma of skin of lip: Secondary | ICD-10-CM | POA: Diagnosis not present

## 2021-07-10 DIAGNOSIS — D1801 Hemangioma of skin and subcutaneous tissue: Secondary | ICD-10-CM | POA: Diagnosis not present

## 2021-07-10 DIAGNOSIS — D229 Melanocytic nevi, unspecified: Secondary | ICD-10-CM | POA: Diagnosis not present

## 2021-07-10 DIAGNOSIS — Z1283 Encounter for screening for malignant neoplasm of skin: Secondary | ICD-10-CM | POA: Diagnosis not present

## 2021-07-10 DIAGNOSIS — C44319 Basal cell carcinoma of skin of other parts of face: Secondary | ICD-10-CM | POA: Diagnosis not present

## 2021-07-19 DIAGNOSIS — R1011 Right upper quadrant pain: Secondary | ICD-10-CM | POA: Diagnosis not present

## 2021-07-19 DIAGNOSIS — K5120 Ulcerative (chronic) proctitis without complications: Secondary | ICD-10-CM | POA: Diagnosis not present

## 2021-08-27 DIAGNOSIS — C4401 Basal cell carcinoma of skin of lip: Secondary | ICD-10-CM | POA: Diagnosis not present

## 2021-09-03 ENCOUNTER — Other Ambulatory Visit (HOSPITAL_BASED_OUTPATIENT_CLINIC_OR_DEPARTMENT_OTHER): Payer: Self-pay

## 2021-09-03 MED ORDER — ZOSTER VAC RECOMB ADJUVANTED 50 MCG/0.5ML IM SUSR
INTRAMUSCULAR | 0 refills | Status: AC
  Filled 2021-09-03: qty 0.5, 1d supply, fill #0

## 2021-10-04 DIAGNOSIS — K2970 Gastritis, unspecified, without bleeding: Secondary | ICD-10-CM | POA: Diagnosis not present

## 2021-10-04 DIAGNOSIS — R1011 Right upper quadrant pain: Secondary | ICD-10-CM | POA: Diagnosis not present

## 2021-10-04 DIAGNOSIS — K2930 Chronic superficial gastritis without bleeding: Secondary | ICD-10-CM | POA: Diagnosis not present

## 2021-10-04 DIAGNOSIS — K317 Polyp of stomach and duodenum: Secondary | ICD-10-CM | POA: Diagnosis not present

## 2021-10-04 DIAGNOSIS — R1013 Epigastric pain: Secondary | ICD-10-CM | POA: Diagnosis not present

## 2021-10-31 DIAGNOSIS — R3915 Urgency of urination: Secondary | ICD-10-CM | POA: Diagnosis not present

## 2021-10-31 DIAGNOSIS — R102 Pelvic and perineal pain: Secondary | ICD-10-CM | POA: Diagnosis not present

## 2021-10-31 DIAGNOSIS — N401 Enlarged prostate with lower urinary tract symptoms: Secondary | ICD-10-CM | POA: Diagnosis not present

## 2021-11-14 DIAGNOSIS — Z86010 Personal history of colonic polyps: Secondary | ICD-10-CM | POA: Diagnosis not present

## 2021-11-14 DIAGNOSIS — R1011 Right upper quadrant pain: Secondary | ICD-10-CM | POA: Diagnosis not present

## 2022-02-20 DIAGNOSIS — Z86010 Personal history of colonic polyps: Secondary | ICD-10-CM | POA: Diagnosis not present

## 2022-02-20 DIAGNOSIS — R109 Unspecified abdominal pain: Secondary | ICD-10-CM | POA: Diagnosis not present

## 2022-05-16 DIAGNOSIS — H5203 Hypermetropia, bilateral: Secondary | ICD-10-CM | POA: Diagnosis not present

## 2022-05-16 DIAGNOSIS — H52203 Unspecified astigmatism, bilateral: Secondary | ICD-10-CM | POA: Diagnosis not present

## 2022-05-16 DIAGNOSIS — H1789 Other corneal scars and opacities: Secondary | ICD-10-CM | POA: Diagnosis not present

## 2022-05-16 DIAGNOSIS — H2513 Age-related nuclear cataract, bilateral: Secondary | ICD-10-CM | POA: Diagnosis not present

## 2022-05-29 DIAGNOSIS — Z0000 Encounter for general adult medical examination without abnormal findings: Secondary | ICD-10-CM | POA: Diagnosis not present

## 2022-06-11 DIAGNOSIS — Z6825 Body mass index (BMI) 25.0-25.9, adult: Secondary | ICD-10-CM | POA: Diagnosis not present

## 2022-06-11 DIAGNOSIS — Z0000 Encounter for general adult medical examination without abnormal findings: Secondary | ICD-10-CM | POA: Diagnosis not present

## 2022-06-25 DIAGNOSIS — M799 Soft tissue disorder, unspecified: Secondary | ICD-10-CM | POA: Diagnosis not present

## 2022-06-25 DIAGNOSIS — R2231 Localized swelling, mass and lump, right upper limb: Secondary | ICD-10-CM | POA: Diagnosis not present

## 2022-07-05 DIAGNOSIS — H16041 Marginal corneal ulcer, right eye: Secondary | ICD-10-CM | POA: Diagnosis not present

## 2022-07-08 DIAGNOSIS — H16041 Marginal corneal ulcer, right eye: Secondary | ICD-10-CM | POA: Diagnosis not present

## 2022-07-31 DIAGNOSIS — D709 Neutropenia, unspecified: Secondary | ICD-10-CM | POA: Diagnosis not present

## 2022-07-31 DIAGNOSIS — D179 Benign lipomatous neoplasm, unspecified: Secondary | ICD-10-CM | POA: Diagnosis not present

## 2022-08-30 DIAGNOSIS — D1721 Benign lipomatous neoplasm of skin and subcutaneous tissue of right arm: Secondary | ICD-10-CM | POA: Diagnosis not present

## 2022-08-30 DIAGNOSIS — L578 Other skin changes due to chronic exposure to nonionizing radiation: Secondary | ICD-10-CM | POA: Diagnosis not present

## 2022-08-30 DIAGNOSIS — L570 Actinic keratosis: Secondary | ICD-10-CM | POA: Diagnosis not present

## 2022-08-30 DIAGNOSIS — L821 Other seborrheic keratosis: Secondary | ICD-10-CM | POA: Diagnosis not present

## 2022-08-30 DIAGNOSIS — D1801 Hemangioma of skin and subcutaneous tissue: Secondary | ICD-10-CM | POA: Diagnosis not present

## 2022-11-29 DIAGNOSIS — R350 Frequency of micturition: Secondary | ICD-10-CM | POA: Diagnosis not present

## 2022-11-29 DIAGNOSIS — N401 Enlarged prostate with lower urinary tract symptoms: Secondary | ICD-10-CM | POA: Diagnosis not present

## 2023-01-06 ENCOUNTER — Other Ambulatory Visit (HOSPITAL_BASED_OUTPATIENT_CLINIC_OR_DEPARTMENT_OTHER): Payer: Self-pay

## 2023-01-06 MED ORDER — INFLUENZA VIRUS VACC SPLIT PF (FLUZONE) 0.5 ML IM SUSY
0.5000 mL | PREFILLED_SYRINGE | Freq: Once | INTRAMUSCULAR | 0 refills | Status: AC
  Filled 2023-01-06: qty 0.5, 1d supply, fill #0

## 2023-01-06 MED ORDER — COVID-19 MRNA VAC-TRIS(PFIZER) 30 MCG/0.3ML IM SUSY
0.3000 mL | PREFILLED_SYRINGE | Freq: Once | INTRAMUSCULAR | 0 refills | Status: AC
  Filled 2023-01-06: qty 0.3, 1d supply, fill #0

## 2023-01-15 DIAGNOSIS — D179 Benign lipomatous neoplasm, unspecified: Secondary | ICD-10-CM | POA: Diagnosis not present

## 2023-01-15 DIAGNOSIS — Z1330 Encounter for screening examination for mental health and behavioral disorders, unspecified: Secondary | ICD-10-CM | POA: Diagnosis not present

## 2023-02-13 DIAGNOSIS — D179 Benign lipomatous neoplasm, unspecified: Secondary | ICD-10-CM | POA: Diagnosis not present

## 2023-02-13 DIAGNOSIS — R2231 Localized swelling, mass and lump, right upper limb: Secondary | ICD-10-CM | POA: Diagnosis not present

## 2023-04-23 DIAGNOSIS — Z0000 Encounter for general adult medical examination without abnormal findings: Secondary | ICD-10-CM | POA: Diagnosis not present

## 2023-04-23 DIAGNOSIS — R7303 Prediabetes: Secondary | ICD-10-CM | POA: Diagnosis not present

## 2023-04-29 DIAGNOSIS — L821 Other seborrheic keratosis: Secondary | ICD-10-CM | POA: Diagnosis not present

## 2023-04-29 DIAGNOSIS — D225 Melanocytic nevi of trunk: Secondary | ICD-10-CM | POA: Diagnosis not present

## 2023-04-29 DIAGNOSIS — L111 Transient acantholytic dermatosis [Grover]: Secondary | ICD-10-CM | POA: Diagnosis not present

## 2023-04-29 DIAGNOSIS — L814 Other melanin hyperpigmentation: Secondary | ICD-10-CM | POA: Diagnosis not present

## 2023-04-30 DIAGNOSIS — M25461 Effusion, right knee: Secondary | ICD-10-CM | POA: Diagnosis not present

## 2023-05-20 DIAGNOSIS — H524 Presbyopia: Secondary | ICD-10-CM | POA: Diagnosis not present

## 2023-05-20 DIAGNOSIS — R7303 Prediabetes: Secondary | ICD-10-CM | POA: Diagnosis not present

## 2023-05-20 DIAGNOSIS — H2513 Age-related nuclear cataract, bilateral: Secondary | ICD-10-CM | POA: Diagnosis not present

## 2023-06-02 ENCOUNTER — Other Ambulatory Visit: Payer: Self-pay

## 2023-06-02 ENCOUNTER — Encounter (HOSPITAL_BASED_OUTPATIENT_CLINIC_OR_DEPARTMENT_OTHER): Payer: Self-pay | Admitting: Physical Therapy

## 2023-06-02 ENCOUNTER — Ambulatory Visit (HOSPITAL_BASED_OUTPATIENT_CLINIC_OR_DEPARTMENT_OTHER): Payer: BC Managed Care – PPO | Attending: Sports Medicine | Admitting: Physical Therapy

## 2023-06-02 DIAGNOSIS — R600 Localized edema: Secondary | ICD-10-CM | POA: Diagnosis not present

## 2023-06-02 DIAGNOSIS — R2689 Other abnormalities of gait and mobility: Secondary | ICD-10-CM | POA: Diagnosis not present

## 2023-06-02 NOTE — Therapy (Signed)
 OUTPATIENT PHYSICAL THERAPY LOWER EXTREMITY EVALUATION   Patient Name: Bradley Lamb MRN: [id number] DOB:1960-08-06, 63 y.o., male Today's Date: 06/03/2023  END OF SESSION:  PT End of Session - 06/02/23 1218     Visit Number 1    Number of Visits 6    Date for PT Re-Evaluation 07/14/23    PT Start Time 1100    PT Stop Time 1146    PT Time Calculation (min) 46 min    Activity Tolerance Patient tolerated treatment well    Behavior During Therapy WFL for tasks assessed/performed             Past Medical History:  Diagnosis Date   IBS (irritable bowel syndrome)    Ulcerative proctitis (HCC)    Past Surgical History:  Procedure Laterality Date   HERNIA REPAIR     There are no active problems to display for this patient.   PCP: None   REFERRING PROVIDER: Dene Gentry   REFERRING DIAG:  Diagnosis  M25.461 (ICD-10-CM) - Swelling of right knee joint    THERAPY DIAG:  Localized edema  Other abnormalities of gait and mobility  Rationale for Evaluation and Treatment: Rehabilitation  ONSET DATE: Acute onset of knee pain in October   SUBJECTIVE:   SUBJECTIVE STATEMENT: The patient was running in October when he had an acute onset of swelling in his knee. It continued for some time He has recently taken about 7-8 weeks off training. He is an avid cyclist. It has improved significantly in that time. He is back to doing most of what he was doing before in the gym. He continues to have some discomfort going down steps. He also has no been back to running yet.   PERTINENT HISTORY: IBS, Hernia repair  PAIN:  Are you having pain? Nojut swelling   PRECAUTIONS: None  RED FLAGS: None   WEIGHT BEARING RESTRICTIONS: No  FALLS:  Has patient fallen in last 6 months? No  LIVING ENVIRONMENT:  Steps inside the house   OCCUPATION:   Commercial banking   PLOF: Independent  PATIENT GOALS:    NEXT MD VISIT:   OBJECTIVE:  Note: Objective measures were completed  at Evaluation unless otherwise noted.  DIAGNOSTIC FINDINGS:  Per patient not much on the x-ray   PATIENT SURVEYS:  Likely 1x visit   COGNITION: Overall cognitive status: Within functional limits for tasks assessed     SENSATION: WFL  EDEMA:  Per visual inspection minor peri-patellar swelling    POSTURE: No Significant postural limitations  PALPATION: No unexpected TTP   LOWER EXTREMITY ROM:  Passive ROM Right eval Left eval  Hip flexion    Hip extension    Hip abduction    Hip adduction    Hip internal rotation    Hip external rotation    Knee flexion Mild pain at end range  WNL  Knee extension WNL  WNL  Ankle dorsiflexion    Ankle plantarflexion    Ankle inversion    Ankle eversion     (Blank rows = not tested)  LOWER EXTREMITY MMT:  MMT Right eval Left eval  Hip flexion 57.0 54.7  Hip extension    Hip abduction 59.3 55.9  Hip adduction    Hip internal rotation    Hip external rotation    Knee flexion    Knee extension 65.2 73.8  Ankle dorsiflexion    Ankle plantarflexion    Ankle inversion    Ankle eversion     (Blank  rows = not tested)  LOWER EXTREMITY SPECIAL TESTS:  Knee special tests: McMurray's test: negative for medial and lateral   FUNCTIONAL TESTS:  Squat: mild lateral shift to the right   GAIT: No gait deficits                                                                                                                               TREATMENT DATE:   Neuro-re-ed  Program Notes Cable Drill 22.5 lbs x10   Exercises - The Diver  - 1 x daily - 7 x weekly - 3 sets - 10 reps - Forward Step Down Touch with Heel  - 1 x daily - 7 x weekly - 3 sets - 10 reps  Therapy reviewed patients gym program and how to use RPE to grade exercises   There-ex:  - Full Leg Press  - 1 x daily - 7 x weekly - 3 sets - 10 reps   PATIENT EDUCATION:  Education details: Reviewed HEP, symptom management, RPE  Person educated: Patient Education  method: Explanation, Demonstration, Tactile cues, Verbal cues, and Handouts Education comprehension: verbalized understanding, returned demonstration, verbal cues required, tactile cues required, and needs further education  HOME EXERCISE PROGRAM:  Access Code: [email or web address] URL: [email or web address] Date: 06/02/2023 Prepared by: Lorayne Bender  ASSESSMENT:  CLINICAL IMPRESSION: Patient is a 63 year old male who presents following an acute onset of knee swelling in October of 2024. He is doing really well at this time. He is back to most off activity he was doing except distance biking and running He has been working in [employer] He had mild swelling today. We reviewed his gym exercises today and how to grade them. We also reviewed control exercises  OBJECTIVE IMPAIRMENTS: decreased activity tolerance, decreased knowledge of condition, decreased mobility, and increased edema.   ACTIVITY LIMITATIONS: squatting and locomotion level  PARTICIPATION LIMITATIONS:  running, stairs   PERSONAL FACTORS: None   REHAB POTENTIAL: Excellent  CLINICAL DECISION MAKING: Stable/uncomplicated  EVALUATION COMPLEXITY: Low   GOALS: Goals reviewed with patient? Yes  SHORT TERM GOALS: STG=LTG LONG TERM GOALS: Target date: 07/29/2023    Patient will go  up/down 8 steps without pain  Baseline:  Goal status: INITIAL  2.  Patient will return to running without swelling  Baseline:  Goal status: INITIAL  3.  Patient will return to cycling without pain  Baseline:  Goal status: INITIAL   PLAN:  PT FREQUENCY: 1x/week  PT DURATION: 8 weeks  PLANNED INTERVENTIONS: Therapeutic exercises, Therapeutic activity, Neuromuscular re-education, Balance training, Gait training, Patient/Family education, Self Care, Joint mobilization, Stair training, DME instructions, Aquatic Therapy, Dry Needling, Electrical stimulation, Cryotherapy, Moist heat, Taping, Manual therapy, and Re-evaluation.   PLAN FOR  NEXT SESSION:   If patient returns continue to progress single leg stance activity. Consider reviewing instability work. Trouble shoot program. Continue to educate in RPE for exercises grading.    Dessie Coma, PT 06/03/2023,  1:20 PM

## 2023-06-03 ENCOUNTER — Encounter (HOSPITAL_BASED_OUTPATIENT_CLINIC_OR_DEPARTMENT_OTHER): Payer: Self-pay | Admitting: Physical Therapy

## 2023-06-10 DIAGNOSIS — M25561 Pain in right knee: Secondary | ICD-10-CM | POA: Diagnosis not present

## 2023-06-17 DIAGNOSIS — K5120 Ulcerative (chronic) proctitis without complications: Secondary | ICD-10-CM | POA: Diagnosis not present

## 2023-06-17 DIAGNOSIS — R109 Unspecified abdominal pain: Secondary | ICD-10-CM | POA: Diagnosis not present

## 2023-06-17 DIAGNOSIS — Z860100 Personal history of colon polyps, unspecified: Secondary | ICD-10-CM | POA: Diagnosis not present

## 2023-09-02 DIAGNOSIS — J0100 Acute maxillary sinusitis, unspecified: Secondary | ICD-10-CM | POA: Diagnosis not present

## 2023-10-22 DIAGNOSIS — Z0000 Encounter for general adult medical examination without abnormal findings: Secondary | ICD-10-CM | POA: Diagnosis not present

## 2023-10-22 DIAGNOSIS — E785 Hyperlipidemia, unspecified: Secondary | ICD-10-CM | POA: Diagnosis not present

## 2023-10-27 DIAGNOSIS — D225 Melanocytic nevi of trunk: Secondary | ICD-10-CM | POA: Diagnosis not present

## 2023-10-27 DIAGNOSIS — L538 Other specified erythematous conditions: Secondary | ICD-10-CM | POA: Diagnosis not present

## 2023-10-27 DIAGNOSIS — L2989 Other pruritus: Secondary | ICD-10-CM | POA: Diagnosis not present

## 2023-10-27 DIAGNOSIS — Z789 Other specified health status: Secondary | ICD-10-CM | POA: Diagnosis not present

## 2023-10-27 DIAGNOSIS — D485 Neoplasm of uncertain behavior of skin: Secondary | ICD-10-CM | POA: Diagnosis not present

## 2023-10-27 DIAGNOSIS — L821 Other seborrheic keratosis: Secondary | ICD-10-CM | POA: Diagnosis not present

## 2023-10-27 DIAGNOSIS — L820 Inflamed seborrheic keratosis: Secondary | ICD-10-CM | POA: Diagnosis not present

## 2023-10-27 DIAGNOSIS — D692 Other nonthrombocytopenic purpura: Secondary | ICD-10-CM | POA: Diagnosis not present

## 2023-10-27 DIAGNOSIS — L814 Other melanin hyperpigmentation: Secondary | ICD-10-CM | POA: Diagnosis not present

## 2023-10-27 DIAGNOSIS — D2239 Melanocytic nevi of other parts of face: Secondary | ICD-10-CM | POA: Diagnosis not present

## 2023-12-26 DIAGNOSIS — R3915 Urgency of urination: Secondary | ICD-10-CM | POA: Diagnosis not present

## 2023-12-26 DIAGNOSIS — N401 Enlarged prostate with lower urinary tract symptoms: Secondary | ICD-10-CM | POA: Diagnosis not present

## 2023-12-26 DIAGNOSIS — R3912 Poor urinary stream: Secondary | ICD-10-CM | POA: Diagnosis not present

## 2023-12-30 DIAGNOSIS — D179 Benign lipomatous neoplasm, unspecified: Secondary | ICD-10-CM | POA: Diagnosis not present

## 2024-01-13 DIAGNOSIS — D179 Benign lipomatous neoplasm, unspecified: Secondary | ICD-10-CM | POA: Diagnosis not present

## 2024-01-28 DIAGNOSIS — H04123 Dry eye syndrome of bilateral lacrimal glands: Secondary | ICD-10-CM | POA: Diagnosis not present

## 2024-01-29 DIAGNOSIS — Z6826 Body mass index (BMI) 26.0-26.9, adult: Secondary | ICD-10-CM | POA: Diagnosis not present

## 2024-01-29 DIAGNOSIS — L299 Pruritus, unspecified: Secondary | ICD-10-CM | POA: Diagnosis not present

## 2024-01-29 DIAGNOSIS — Z801 Family history of malignant neoplasm of trachea, bronchus and lung: Secondary | ICD-10-CM | POA: Diagnosis not present

## 2024-01-30 DIAGNOSIS — L299 Pruritus, unspecified: Secondary | ICD-10-CM | POA: Diagnosis not present
